# Patient Record
Sex: Female | Born: 1990 | Race: Black or African American | Hispanic: No | Marital: Single | Smoking: Current every day smoker
Health system: Southern US, Community
[De-identification: ages and names within clinical notes are randomized; demographics above are authoritative.]

## PROBLEM LIST (undated history)

## (undated) DIAGNOSIS — G8929 Other chronic pain: Secondary | ICD-10-CM

## (undated) DIAGNOSIS — M549 Dorsalgia, unspecified: Secondary | ICD-10-CM

## (undated) HISTORY — PX: OTHER SURGICAL HISTORY: SHX169

---

## 2013-01-15 ENCOUNTER — Encounter (HOSPITAL_COMMUNITY): Payer: Self-pay | Admitting: Emergency Medicine

## 2013-01-15 ENCOUNTER — Emergency Department (HOSPITAL_BASED_OUTPATIENT_CLINIC_OR_DEPARTMENT_OTHER)
Admission: EM | Admit: 2013-01-15 | Discharge: 2013-01-15 | Discharge status: Against Medical Advice | Payer: Self-pay | Attending: Emergency Medicine | Admitting: Emergency Medicine

## 2013-01-15 ENCOUNTER — Encounter (HOSPITAL_BASED_OUTPATIENT_CLINIC_OR_DEPARTMENT_OTHER): Payer: Self-pay | Admitting: Emergency Medicine

## 2013-01-15 ENCOUNTER — Emergency Department (HOSPITAL_COMMUNITY)
Admission: EM | Admit: 2013-01-15 | Discharge: 2013-01-16 | Disposition: A | Discharge status: Home / Self Care | Payer: Self-pay | Attending: Emergency Medicine | Admitting: Emergency Medicine

## 2013-01-15 DIAGNOSIS — M545 Low back pain, unspecified: Secondary | ICD-10-CM | POA: Insufficient documentation

## 2013-01-15 DIAGNOSIS — IMO0002 Reserved for concepts with insufficient information to code with codable children: Secondary | ICD-10-CM | POA: Insufficient documentation

## 2013-01-15 DIAGNOSIS — F17200 Nicotine dependence, unspecified, uncomplicated: Secondary | ICD-10-CM | POA: Insufficient documentation

## 2013-01-15 DIAGNOSIS — G8929 Other chronic pain: Secondary | ICD-10-CM | POA: Insufficient documentation

## 2013-01-15 DIAGNOSIS — R52 Pain, unspecified: Secondary | ICD-10-CM | POA: Insufficient documentation

## 2013-01-15 DIAGNOSIS — Y929 Unspecified place or not applicable: Secondary | ICD-10-CM | POA: Insufficient documentation

## 2013-01-15 DIAGNOSIS — Y9389 Activity, other specified: Secondary | ICD-10-CM | POA: Insufficient documentation

## 2013-01-15 DIAGNOSIS — W108XXA Fall (on) (from) other stairs and steps, initial encounter: Secondary | ICD-10-CM | POA: Insufficient documentation

## 2013-01-15 DIAGNOSIS — M549 Dorsalgia, unspecified: Secondary | ICD-10-CM

## 2013-01-15 HISTORY — DX: Other chronic pain: G89.29

## 2013-01-15 HISTORY — DX: Dorsalgia, unspecified: M54.9

## 2013-01-15 MED ORDER — HYDROMORPHONE HCL PF 1 MG/ML IJ SOLN
1.0000 mg | Freq: Once | INTRAMUSCULAR | Status: AC
  Administered 2013-01-16: 1 mg via INTRAMUSCULAR
  Filled 2013-01-15: qty 1

## 2013-01-15 NOTE — ED Provider Notes (Signed)
CSN: [id number]     Arrival date & time 01/15/13  2226 History  This chart was scribed for non-physician practitioner Ruby Cola, PA-C working with Lyanne Co, MD by Joaquin Music, ED Scribe. This patient was seen in room WTR7/WTR7 and the patient's care was started at 11:45 PM .      Chief Complaint  Patient presents with  . Back Pain   HPI HPI Comments: Chelsea Young is a 22 y.o. female who presents to the Emergency Department complaining of non-traumatic, acute on chronic, non-radiating, R lower back pain onset 24 hours.  Associated w/  numbness and intermittent giving way of RLE.  This problem is new as of today, and she fell the first time. She states she takes OTC Ibuprofen with no relief. Pt denies fever,  bladder and bowel incontinence. Pt denies recent injuries.  Pt states she does not currently have a PCP. [place]    Past Medical History  Diagnosis Date  . Chronic back pain    Past Surgical History  Procedure Laterality Date  . C sections    . Extraction of wisdom teeth     Family History  Problem Relation Age of Onset  . Diabetes Other   . Hypertension Other    History  Substance Use Topics  . Smoking status: Current Every Day Smoker -- 0.50 packs/day    Types: Cigarettes  . Smokeless tobacco: Not on file  . Alcohol Use: No   OB History   Grav Para Term Preterm Abortions TAB SAB Ect Mult Living                 Review of Systems  All other systems reviewed and are negative.    Allergies  Review of patient's allergies indicates no known allergies.  Home Medications   Current Outpatient Rx  Name  Route  Sig  Dispense  Refill  . ibuprofen (ADVIL,MOTRIN) 200 MG tablet   Oral   Take 200 mg by mouth every 6 (six) hours as needed for pain.          Triage Vitals:BP 103/58  Pulse 86  Temp(Src) 98.2 F (36.8 C) (Oral)  Resp 14  SpO2 98%  LMP 01/07/2013  Physical Exam  Nursing note and  vitals reviewed. Constitutional: She is oriented to person, place, and time. She appears well-developed and well-nourished.  HENT:  Head: Normocephalic and atraumatic.  Eyes:  Normal appearance  Neck: Normal range of motion.  Cardiovascular: Normal rate and regular rhythm.   Pulmonary/Chest: Effort normal and breath sounds normal.  Genitourinary:  No CVA ttp  Musculoskeletal:  Mild soft tissue tenderness R lumbar region.  Entire spine non-tender. Full active ROM of LE.  Nml patellar reflexes.  No saddle anesthesia.  Nml rectal tone.  5/5 hip abduction/adduction and ankle plantar/dorsiflexion strength.  Distal sensation intact.  2+ DP pulses.  Appears uncomfortable w/ ambulation.   Neurological: She is alert and oriented to person, place, and time.  Skin: Skin is warm and dry. No rash noted.  Psychiatric: She has a normal mood and affect. Her behavior is normal.    ED Course  Procedures  Labs Review Labs Reviewed - No data to display Imaging Review No results found.  EKG Interpretation   None       MDM   1. Back pain    Healthy 22yo F presents w/ non-traumatic, acute on chronic low back pain w/ new onset intermittent RLE numbness/giving way.  I  am unable to examine patient's back at this time because pain limiting her ability to change position.  Afebrile and no NV deficits extremities.  Will treat pain and reassess shortly. 12:10 AM   Patient has received a total of 2mg  IM dilaudid and 5mg  po valium.  She reports that her pain is minimally improved.  She is comfortable at rest but pain shoots to 8/10 when she attempts to move.  Nursing staff to ambulate.  3:16 AM   Patient shuffled her feet and appeared to be in a lot of pain w/ ambulation.  Discussed case w/ Dr. Patria Mane who examined her himself and recommended IV toradol/diluadid.  Pt to be discharged home w/ pain medication and referral to primary care. 5:04 AM   I personally performed the services described in this  documentation, which was scribed in my presence. The recorded information has been reviewed and is accurate.    Otilio Miu, PA-C 01/16/13 [phone]

## 2013-01-15 NOTE — ED Notes (Signed)
Pt states she has chronic back pain and today she stepped down off a step to go to work and it caused her pain  Pt states it has been hurting ever since

## 2013-01-15 NOTE — ED Notes (Signed)
Back pain in her lower back. Hx of chronic back pain.

## 2013-01-16 MED ORDER — CYCLOBENZAPRINE HCL 10 MG PO TABS: 10.0000 mg | ORAL_TABLET | Freq: Two times a day (BID) | ORAL | Status: DC | PRN

## 2013-01-16 MED ORDER — HYDROMORPHONE HCL PF 1 MG/ML IJ SOLN
1.0000 mg | Freq: Once | INTRAMUSCULAR | Status: AC
  Administered 2013-01-16: 1 mg via INTRAMUSCULAR
  Filled 2013-01-16: qty 1

## 2013-01-16 MED ORDER — IBUPROFEN 600 MG PO TABS: 600.0000 mg | ORAL_TABLET | Freq: Three times a day (TID) | ORAL | Status: DC | PRN

## 2013-01-16 MED ORDER — DIAZEPAM 5 MG PO TABS
5.0000 mg | ORAL_TABLET | Freq: Once | ORAL | Status: AC
  Administered 2013-01-16: 5 mg via ORAL
  Filled 2013-01-16: qty 1

## 2013-01-16 MED ORDER — HYDROCODONE-ACETAMINOPHEN 5-325 MG PO TABS: 1.0000 | ORAL_TABLET | ORAL | Status: DC | PRN

## 2013-01-16 MED ORDER — KETOROLAC TROMETHAMINE 30 MG/ML IJ SOLN
30.0000 mg | Freq: Once | INTRAMUSCULAR | Status: AC
  Administered 2013-01-16: 30 mg via INTRAVENOUS
  Filled 2013-01-16: qty 1

## 2013-01-16 MED ORDER — HYDROMORPHONE HCL PF 1 MG/ML IJ SOLN
1.0000 mg | Freq: Once | INTRAMUSCULAR | Status: AC
  Administered 2013-01-16: 1 mg via INTRAVENOUS
  Filled 2013-01-16: qty 1

## 2013-01-16 NOTE — ED Notes (Signed)
Pt was able to ambulate with stand by assist, pt sts she's not as stiff as before but still hurting on the right side.

## 2013-01-16 NOTE — ED Provider Notes (Signed)
Medical screening examination/treatment/procedure(s) were conducted as a shared visit with non-physician practitioner(s) and myself.  I personally evaluated the patient during the encounter.  EKG Interpretation   None        Ambulatory in the room. Home with pcp follow up.  Likely musculoskeletal back pain.  No back pain red flags  Lyanne Co, MD 01/16/13 [phone]

## 2013-01-16 NOTE — ED Notes (Signed)
Up for discharge and has no ride home at this time  Pt drove self here and unable to drive home due to receiving medication  Pt states she wants a back brace

## 2013-11-24 ENCOUNTER — Emergency Department (HOSPITAL_COMMUNITY)
Admission: EM | Admit: 2013-11-24 | Discharge: 2013-11-24 | Discharge status: Against Medical Advice | Payer: Self-pay | Attending: Emergency Medicine | Admitting: Emergency Medicine

## 2013-11-24 DIAGNOSIS — F17200 Nicotine dependence, unspecified, uncomplicated: Secondary | ICD-10-CM | POA: Insufficient documentation

## 2013-11-24 DIAGNOSIS — S199XXA Unspecified injury of neck, initial encounter: Principal | ICD-10-CM

## 2013-11-24 DIAGNOSIS — G8929 Other chronic pain: Secondary | ICD-10-CM | POA: Insufficient documentation

## 2013-11-24 DIAGNOSIS — S0993XA Unspecified injury of face, initial encounter: Secondary | ICD-10-CM | POA: Insufficient documentation

## 2013-11-24 NOTE — ED Notes (Signed)
Pt continues to refuse to be seen. Socks given, pt transported with no shoes

## 2013-11-24 NOTE — ED Notes (Signed)
In room to triage patient. Pt states she does not want to be seen at this time.  Attempted to convince pt to stay at get jaw pain evaluated. Pt refusing at this time.

## 2013-11-24 NOTE — ED Notes (Signed)
Pt walked from department, waiting on ride in [place]
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
## 2014-08-12 DIAGNOSIS — M549 Dorsalgia, unspecified: Secondary | ICD-10-CM | POA: Insufficient documentation

## 2014-08-12 DIAGNOSIS — G8929 Other chronic pain: Secondary | ICD-10-CM | POA: Insufficient documentation

## 2014-08-12 DIAGNOSIS — R000 Tachycardia, unspecified: Secondary | ICD-10-CM | POA: Insufficient documentation

## 2014-08-12 DIAGNOSIS — Z3202 Encounter for pregnancy test, result negative: Secondary | ICD-10-CM | POA: Insufficient documentation

## 2014-08-12 DIAGNOSIS — J069 Acute upper respiratory infection, unspecified: Secondary | ICD-10-CM | POA: Insufficient documentation

## 2014-08-12 DIAGNOSIS — Z79899 Other long term (current) drug therapy: Secondary | ICD-10-CM | POA: Insufficient documentation

## 2014-08-12 DIAGNOSIS — Z720 Tobacco use: Secondary | ICD-10-CM | POA: Insufficient documentation

## 2014-08-13 ENCOUNTER — Emergency Department (HOSPITAL_COMMUNITY)
Admission: EM | Admit: 2014-08-13 | Discharge: 2014-08-13 | Disposition: A | Discharge status: Home / Self Care | Payer: Medicaid Other | Attending: Emergency Medicine | Admitting: Emergency Medicine

## 2014-08-13 ENCOUNTER — Emergency Department (HOSPITAL_COMMUNITY): Payer: Medicaid Other

## 2014-08-13 ENCOUNTER — Encounter (HOSPITAL_COMMUNITY): Payer: Self-pay | Admitting: *Deleted

## 2014-08-13 DIAGNOSIS — R059 Cough, unspecified: Secondary | ICD-10-CM

## 2014-08-13 DIAGNOSIS — J069 Acute upper respiratory infection, unspecified: Secondary | ICD-10-CM

## 2014-08-13 DIAGNOSIS — R05 Cough: Secondary | ICD-10-CM

## 2014-08-13 LAB — I-STAT BETA HCG BLOOD, ED (MC, WL, AP ONLY): I-stat hCG, quantitative: <5 m[IU]/mL (ref <5)

## 2014-08-13 MED ORDER — PROMETHAZINE-PHENYLEPHRINE 6.25-5 MG/5ML PO SYRP: 5.0000 mL | ORAL_SOLUTION | ORAL | Status: DC | PRN

## 2014-08-13 MED ORDER — GUAIFENESIN 100 MG/5ML PO SOLN
5.0000 mL | Freq: Once | ORAL | Status: DC
  Filled 2014-08-13: qty 5

## 2014-08-13 NOTE — ED Provider Notes (Signed)
CSN: [id number]     Arrival date & time 08/12/14  2354 History   First MD Initiated Contact with Patient 08/13/14 0144     Chief Complaint  Patient presents with  . Cough  . Back Pain     (Consider location/radiation/quality/duration/timing/severity/associated sxs/prior Treatment) Patient is a 24 y.o. female presenting with cough and back pain. The history is provided by the patient.  Cough Cough characteristics:  Non-productive and harsh Severity:  Moderate Onset quality:  Gradual Duration:  2 days Timing:  Intermittent Progression:  Unchanged Chronicity:  New Smoker: yes   Context: upper respiratory infection   Relieved by:  Nothing Worsened by:  Nothing tried Ineffective treatments:  Cough suppressants Associated symptoms: rhinorrhea   Associated symptoms: no chest pain, no fever, no myalgias, no shortness of breath, no sinus congestion, no sore throat and no wheezing   Rhinorrhea:    Quality:  Clear   Severity:  Moderate   Timing:  Intermittent   Progression:  Worsening Back Pain Associated symptoms: no chest pain and no fever     Past Medical History  Diagnosis Date  . Chronic back pain    Past Surgical History  Procedure Laterality Date  . C sections    . Extraction of wisdom teeth     Family History  Problem Relation Age of Onset  . Diabetes Other   . Hypertension Other    History  Substance Use Topics  . Smoking status: Current Every Day Smoker -- 0.50 packs/day    Types: Cigarettes  . Smokeless tobacco: Not on file  . Alcohol Use: No   OB History    No data available     Review of Systems  Constitutional: Negative for fever.  HENT: Positive for congestion, postnasal drip and rhinorrhea. Negative for sore throat.   Respiratory: Positive for cough. Negative for shortness of breath and wheezing.   Cardiovascular: Negative for chest pain.  Gastrointestinal: Negative for nausea and vomiting.  Musculoskeletal: Positive for back pain. Negative for  myalgias.  Skin: Negative for wound.  All other systems reviewed and are negative.     Allergies  Review of patient's allergies indicates no known allergies.  Home Medications   Prior to Admission medications   Medication Sig Start Date End Date Taking? Authorizing Provider  CRANBERRY PO Take 1 capsule by mouth daily.   Yes Historical Provider, MD  cyclobenzaprine (FLEXERIL) 10 MG tablet Take 1 tablet (10 mg total) by mouth 2 (two) times daily as needed for muscle spasms. Patient not taking: Reported on 08/13/2014 01/16/13   Azalia Bilis, MD  HYDROcodone-acetaminophen (NORCO/VICODIN) 5-325 MG per tablet Take 1 tablet by mouth every 4 (four) hours as needed for pain. Patient not taking: Reported on 08/13/2014 01/16/13   Azalia Bilis, MD  ibuprofen (ADVIL,MOTRIN) 200 MG tablet Take 200 mg by mouth every 6 (six) hours as needed for pain.    Historical Provider, MD  ibuprofen (ADVIL,MOTRIN) 600 MG tablet Take 1 tablet (600 mg total) by mouth every 8 (eight) hours as needed for pain. Patient not taking: Reported on 08/13/2014 01/16/13   Azalia Bilis, MD  promethazine-phenylephrine (PROMETHAZINE VC) 6.25-5 MG/5ML SYRP Take 5 mLs by mouth every 4 (four) hours as needed for congestion. 08/13/14   Earley Favor, NP   BP 135/82 mmHg  Pulse 92  Temp(Src) 98.1 F (36.7 C) (Oral)  Resp 18  SpO2 100%  LMP 07/29/2014 Physical Exam  Constitutional: She is oriented to person, place, and time. She appears well-developed  and well-nourished.  HENT:  Head: Normocephalic.  Mouth/Throat: Oropharynx is clear and moist.  Eyes: Pupils are equal, round, and reactive to light.  Neck: Normal range of motion.  Cardiovascular: Regular rhythm.  Tachycardia present.   Pulmonary/Chest: Effort normal. No respiratory distress. She has no wheezes. She has no rales. She exhibits tenderness.  Musculoskeletal: Normal range of motion.  Neurological: She is alert and oriented to person, place, and time.  Skin: Skin is  warm and dry.  Nursing note and vitals reviewed.   ED Course  Procedures (including critical care time) Labs Review Labs Reviewed  I-STAT BETA HCG BLOOD, ED ([facility], [facility], [facility] ONLY)    Imaging Review Dg Chest 2 View  08/13/2014   CLINICAL DATA:  Nonproductive cough.  Congestion and chest tightness  EXAM: CHEST  2 VIEW  COMPARISON:  None.  FINDINGS: Normal heart size and mediastinal contours. No acute infiltrate or edema. No effusion or pneumothorax. No acute osseous findings.  IMPRESSION: Negative chest.   Electronically Signed   By: Marnee SpringJonathon  Watts M.D.   On: 08/13/2014 02:31     EKG Interpretation None      MDM   Final diagnoses:  Cough  URI (upper respiratory infection)         Earley FavorGail Kaylin Marcon, NP 08/13/14 2004  Devoria AlbeIva Knapp, MD 08/21/14 2302

## 2014-08-13 NOTE — ED Notes (Signed)
Pt c/o cough and congestion x 2 days; pt states that her chest feel tight at times from coughing; pt states that she is not coughing up anything; pt states that she has had chronic back pain for more than 2 years and that the constant coughing over the last 2 days has made it worse

## 2014-08-13 NOTE — Discharge Instructions (Signed)
Cough, Adult  A cough is a reflex. It helps you clear your throat and airways. A cough can help heal your body. A cough can last 2 or 3 weeks (acute) or may last more than 8 weeks (chronic). Some common causes of a cough can include an infection, allergy, or a cold. HOME CARE  Only take medicine as told by your doctor.  If given, take your medicines (antibiotics) as told. Finish them even if you start to feel better.  Use a cold steam vaporizer or humidifier in your home. This can help loosen thick spit (secretions).  Sleep so you are almost sitting up (semi-upright). Use pillows to do this. This helps reduce coughing.  Rest as needed.  Stop smoking if you smoke. GET HELP RIGHT AWAY IF:  You have yellowish-white fluid (pus) in your thick spit.  Your cough gets worse.  Your medicine does not reduce coughing, and you are losing sleep.  You cough up blood.  You have trouble breathing.  Your pain gets worse and medicine does not help.  You have a fever. MAKE SURE YOU:   Understand these instructions.  Will watch your condition.  Will get help right away if you are not doing well or get worse. Document Released: 11/19/2010 Document Revised: 07/23/2013 Document Reviewed: 11/19/2010 [facility] Patient Information 2015 [place], [place]. This information is not intended to replace advice given to you by your health care provider. Make sure you discuss any questions you have with your health care provider. Chest x-ray does not show any pneumonia or bronchitis.  He been given a prescription for promethazine with renal (this will help with both you nasal congestion and her cough

## 2015-02-26 ENCOUNTER — Encounter (HOSPITAL_COMMUNITY): Payer: Self-pay

## 2015-02-26 ENCOUNTER — Emergency Department (HOSPITAL_COMMUNITY): Payer: Medicaid Other

## 2015-02-26 ENCOUNTER — Emergency Department (HOSPITAL_COMMUNITY)
Admission: EM | Admit: 2015-02-26 | Discharge: 2015-02-26 | Disposition: A | Discharge status: Home / Self Care | Payer: Medicaid Other | Attending: Emergency Medicine | Admitting: Emergency Medicine

## 2015-02-26 DIAGNOSIS — G8929 Other chronic pain: Secondary | ICD-10-CM | POA: Insufficient documentation

## 2015-02-26 DIAGNOSIS — S99921A Unspecified injury of right foot, initial encounter: Secondary | ICD-10-CM | POA: Diagnosis not present

## 2015-02-26 DIAGNOSIS — S99911A Unspecified injury of right ankle, initial encounter: Secondary | ICD-10-CM | POA: Diagnosis present

## 2015-02-26 DIAGNOSIS — Y9289 Other specified places as the place of occurrence of the external cause: Secondary | ICD-10-CM | POA: Diagnosis not present

## 2015-02-26 DIAGNOSIS — F17210 Nicotine dependence, cigarettes, uncomplicated: Secondary | ICD-10-CM | POA: Insufficient documentation

## 2015-02-26 DIAGNOSIS — S93401A Sprain of unspecified ligament of right ankle, initial encounter: Secondary | ICD-10-CM | POA: Diagnosis not present

## 2015-02-26 DIAGNOSIS — M79671 Pain in right foot: Secondary | ICD-10-CM

## 2015-02-26 DIAGNOSIS — Y9389 Activity, other specified: Secondary | ICD-10-CM | POA: Insufficient documentation

## 2015-02-26 DIAGNOSIS — W010XXA Fall on same level from slipping, tripping and stumbling without subsequent striking against object, initial encounter: Secondary | ICD-10-CM | POA: Insufficient documentation

## 2015-02-26 DIAGNOSIS — Y998 Other external cause status: Secondary | ICD-10-CM | POA: Diagnosis not present

## 2015-02-26 MED ORDER — IBUPROFEN 800 MG PO TABS: 800.0000 mg | ORAL_TABLET | Freq: Three times a day (TID) | ORAL | Status: DC

## 2015-02-26 NOTE — ED Provider Notes (Signed)
CSN: [id number]     Arrival date & time 02/26/15  1034 History   First MD Initiated Contact with Patient 02/26/15 1101     Chief Complaint  Patient presents with  . Leg Pain  . Ankle Pain     (Consider location/radiation/quality/duration/timing/severity/associated sxs/prior Treatment) HPI Comments: Pt comes in with c/o right ankle and foot pain that started this morning after tripping over the dog. Denies numbness or weakness. Has been able to ambulate. Denies previous injury. Has not taken any medication or used any supportive care  The history is provided by the patient. No language interpreter was used.    Past Medical History  Diagnosis Date  . Chronic back pain    Past Surgical History  Procedure Laterality Date  . C sections    . Extraction of wisdom teeth     Family History  Problem Relation Age of Onset  . Diabetes Other   . Hypertension Other    Social History  Substance Use Topics  . Smoking status: Current Every Day Smoker -- 0.50 packs/day    Types: Cigarettes  . Smokeless tobacco: None  . Alcohol Use: No   OB History    No data available     Review of Systems  All other systems reviewed and are negative.     Allergies  Review of patient's allergies indicates no known allergies.  Home Medications   Prior to Admission medications   Medication Sig Start Date End Date Taking? Authorizing Provider  ibuprofen (ADVIL,MOTRIN) 200 MG tablet Take 200-400 mg by mouth every 6 (six) hours as needed for headache, mild pain or moderate pain.    Yes Historical Provider, MD  cyclobenzaprine (FLEXERIL) 10 MG tablet Take 1 tablet (10 mg total) by mouth 2 (two) times daily as needed for muscle spasms. Patient not taking: Reported on 08/13/2014 01/16/13   Azalia BilisKevin Campos, MD   BP 102/76 mmHg  Pulse 93  Temp(Src) 98.2 F (36.8 C) (Oral)  Resp 16  SpO2 100%  LMP 02/12/2015 Physical Exam  Constitutional: She is oriented to person, place, and time. She appears  well-developed and well-nourished.  Cardiovascular: Normal rate and regular rhythm.   Pulmonary/Chest: Effort normal and breath sounds normal.  Musculoskeletal: Normal range of motion.  Generalized tenderness to right foot and ankle. Full rom. Pulses intact. No deformity or swelling noted  Neurological: She is alert and oriented to person, place, and time.  Skin: Skin is warm and dry.  Psychiatric: She has a normal mood and affect.  Nursing note and vitals reviewed.   ED Course  Procedures (including critical care time) Labs Review Labs Reviewed - No data to display  Imaging Review Dg Ankle Complete Right  02/26/2015  CLINICAL DATA:  Injured ankle/ foot last evening while walking dog. EXAM: RIGHT ANKLE - COMPLETE 3+ VIEW; RIGHT FOOT COMPLETE - 3+ VIEW COMPARISON:  None. FINDINGS: Right ankle: The ankle mortise is maintained. No acute ankle fracture. No osteochondral lesion. No joint effusion. Right foot: The joint spaces are maintained.  No acute fracture. IMPRESSION: No acute fracture. Electronically Signed   By: Rudie MeyerP.  Gallerani M.D.   On: 02/26/2015 12:31   Dg Foot Complete Right  02/26/2015  CLINICAL DATA:  Injured ankle/ foot last evening while walking dog. EXAM: RIGHT ANKLE - COMPLETE 3+ VIEW; RIGHT FOOT COMPLETE - 3+ VIEW COMPARISON:  None. FINDINGS: Right ankle: The ankle mortise is maintained. No acute ankle fracture. No osteochondral lesion. No joint effusion. Right foot: The joint spaces  are maintained.  No acute fracture. IMPRESSION: No acute fracture. Electronically Signed   By: Rudie Meyer M.D.   On: 02/26/2015 12:31   I have personally reviewed and evaluated these images and lab results as part of my medical decision-making.   EKG Interpretation None      MDM   Final diagnoses:  Ankle sprain, right, initial encounter  Right foot pain    No acute bony injury noted. Will put in aso and crutches for comfort. Neurovascularly intact    Teressa Lower, NP 02/26/15  1239  Mancel Bale, MD 02/26/15 1620

## 2015-02-26 NOTE — Discharge Instructions (Signed)
Ankle Sprain  An ankle sprain is an injury to the strong, fibrous tissues (ligaments) that hold the bones of your ankle joint together.   CAUSES  An ankle sprain is usually caused by a fall or by twisting your ankle. Ankle sprains most commonly occur when you step on the outer edge of your foot, and your ankle turns inward. People who participate in sports are more prone to these types of injuries.   SYMPTOMS    Pain in your ankle. The pain may be present at rest or only when you are trying to stand or walk.   Swelling.   Bruising. Bruising may develop immediately or within 1 to 2 days after your injury.   Difficulty standing or walking, particularly when turning corners or changing directions.  DIAGNOSIS   Your caregiver will ask you details about your injury and perform a physical exam of your ankle to determine if you have an ankle sprain. During the physical exam, your caregiver will press on and apply pressure to specific areas of your foot and ankle. Your caregiver will try to move your ankle in certain ways. An X-ray exam may be done to be sure a bone was not broken or a ligament did not separate from one of the bones in your ankle (avulsion fracture).   TREATMENT   Certain types of braces can help stabilize your ankle. Your caregiver can make a recommendation for this. Your caregiver may recommend the use of medicine for pain. If your sprain is severe, your caregiver may refer you to a surgeon who helps to restore function to parts of your skeletal system (orthopedist) or a physical therapist.  HOME CARE INSTRUCTIONS    Apply ice to your injury for 1-2 days or as directed by your caregiver. Applying ice helps to reduce inflammation and pain.    Put ice in a plastic bag.    Place a towel between your skin and the bag.    Leave the ice on for 15-20 minutes at a time, every 2 hours while you are awake.   Only take over-the-counter or prescription medicines for pain, discomfort, or fever as directed by  your caregiver.   Elevate your injured ankle above the level of your heart as much as possible for 2-3 days.   If your caregiver recommends crutches, use them as instructed. Gradually put weight on the affected ankle. Continue to use crutches or a cane until you can walk without feeling pain in your ankle.   If you have a plaster splint, wear the splint as directed by your caregiver. Do not rest it on anything harder than a pillow for the first 24 hours. Do not put weight on it. Do not get it wet. You may take it off to take a shower or bath.   You may have been given an elastic bandage to wear around your ankle to provide support. If the elastic bandage is too tight (you have numbness or tingling in your foot or your foot becomes cold and blue), adjust the bandage to make it comfortable.   If you have an air splint, you may blow more air into it or let air out to make it more comfortable. You may take your splint off at night and before taking a shower or bath. Wiggle your toes in the splint several times per day to decrease swelling.  SEEK MEDICAL CARE IF:    You have rapidly increasing bruising or swelling.   Your toes feel   extremely cold or you lose feeling in your foot.   Your pain is not relieved with medicine.  SEEK IMMEDIATE MEDICAL CARE IF:   Your toes are numb or blue.   You have severe pain that is increasing.  MAKE SURE YOU:    Understand these instructions.   Will watch your condition.   Will get help right away if you are not doing well or get worse.     This information is not intended to replace advice given to you by your health care provider. Make sure you discuss any questions you have with your health care provider.     Document Released: 03/08/2005 Document Revised: 03/29/2014 Document Reviewed: 03/20/2011  [employer]vier Interactive Patient Education 2016 [employer]

## 2015-02-26 NOTE — ED Notes (Addendum)
Pt c/o R leg and ankle pain after a fall this morning.  Pain score 10/10.  Pt reports taking ibuprofen w/o relief.  Pt reports she "stepped wrong and fell."  Denies numbness and tingling.  No deformities noted.

## 2015-04-14 ENCOUNTER — Emergency Department (HOSPITAL_COMMUNITY): Payer: Medicaid Other

## 2015-04-14 ENCOUNTER — Emergency Department (HOSPITAL_COMMUNITY)
Admission: EM | Admit: 2015-04-14 | Discharge: 2015-04-15 | Disposition: A | Discharge status: Home / Self Care | Payer: Medicaid Other | Attending: Emergency Medicine | Admitting: Emergency Medicine

## 2015-04-14 ENCOUNTER — Encounter (HOSPITAL_COMMUNITY): Payer: Self-pay

## 2015-04-14 DIAGNOSIS — G8929 Other chronic pain: Secondary | ICD-10-CM | POA: Diagnosis not present

## 2015-04-14 DIAGNOSIS — S0083XA Contusion of other part of head, initial encounter: Secondary | ICD-10-CM | POA: Diagnosis not present

## 2015-04-14 DIAGNOSIS — Y998 Other external cause status: Secondary | ICD-10-CM | POA: Insufficient documentation

## 2015-04-14 DIAGNOSIS — F17210 Nicotine dependence, cigarettes, uncomplicated: Secondary | ICD-10-CM | POA: Diagnosis not present

## 2015-04-14 DIAGNOSIS — Y9389 Activity, other specified: Secondary | ICD-10-CM | POA: Insufficient documentation

## 2015-04-14 DIAGNOSIS — Y9289 Other specified places as the place of occurrence of the external cause: Secondary | ICD-10-CM | POA: Insufficient documentation

## 2015-04-14 DIAGNOSIS — S0993XA Unspecified injury of face, initial encounter: Secondary | ICD-10-CM | POA: Diagnosis present

## 2015-04-14 DIAGNOSIS — Z23 Encounter for immunization: Secondary | ICD-10-CM | POA: Diagnosis not present

## 2015-04-14 MED ORDER — TETANUS-DIPHTH-ACELL PERTUSSIS 5-2.5-18.5 LF-MCG/0.5 IM SUSP
0.5000 mL | Freq: Once | INTRAMUSCULAR | Status: AC
  Administered 2015-04-14: 0.5 mL via INTRAMUSCULAR
  Filled 2015-04-14: qty 0.5

## 2015-04-14 MED ORDER — FENTANYL CITRATE (PF) 100 MCG/2ML IJ SOLN
50.0000 ug | Freq: Once | INTRAMUSCULAR | Status: AC
  Administered 2015-04-14: 50 ug via INTRAVENOUS
  Filled 2015-04-14: qty 2

## 2015-04-14 MED ORDER — OXYCODONE-ACETAMINOPHEN 5-325 MG PO TABS: 1.0000 | ORAL_TABLET | Freq: Four times a day (QID) | ORAL | Status: AC | PRN

## 2015-04-14 MED ORDER — ONDANSETRON HCL 4 MG/2ML IJ SOLN
4.0000 mg | Freq: Once | INTRAMUSCULAR | Status: AC
  Administered 2015-04-14: 4 mg via INTRAVENOUS
  Filled 2015-04-14: qty 2

## 2015-04-14 MED ORDER — HYDROMORPHONE HCL 1 MG/ML IJ SOLN
1.0000 mg | Freq: Once | INTRAMUSCULAR | Status: AC
  Administered 2015-04-15: 1 mg via INTRAVENOUS
  Filled 2015-04-14: qty 1

## 2015-04-14 MED ORDER — HYDROMORPHONE HCL 1 MG/ML IJ SOLN
1.0000 mg | Freq: Once | INTRAMUSCULAR | Status: AC
  Administered 2015-04-14: 1 mg via INTRAVENOUS
  Filled 2015-04-14: qty 1

## 2015-04-14 MED ORDER — ONDANSETRON HCL 4 MG/2ML IJ SOLN: 4.0000 mg | Freq: Once | INTRAMUSCULAR | Status: AC
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
## 2015-04-14 NOTE — ED Notes (Signed)
Pt transported to CT ?

## 2015-04-14 NOTE — ED Notes (Signed)
Pt asked to change in to a gown. Pt is requesting pain medicine. Pt informed that a provider must see her first.
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
